# Patient Record
Sex: Female | Born: 1995 | Race: White | Hispanic: No | Marital: Single | Smoking: Never smoker
Health system: Southern US, Community
[De-identification: ages and names within clinical notes are randomized; demographics above are authoritative.]

## PROBLEM LIST (undated history)

## (undated) HISTORY — PX: ANTERIOR CRUCIATE LIGAMENT REPAIR: SHX115

---

## 2017-04-09 ENCOUNTER — Emergency Department (HOSPITAL_COMMUNITY): Payer: Medicare (Managed Care)

## 2017-04-09 ENCOUNTER — Encounter (HOSPITAL_COMMUNITY): Payer: Self-pay

## 2017-04-09 ENCOUNTER — Emergency Department (HOSPITAL_COMMUNITY)
Admission: EM | Admit: 2017-04-09 | Discharge: 2017-04-09 | Disposition: A | Discharge status: Home / Self Care | Payer: Medicare (Managed Care) | Attending: Emergency Medicine | Admitting: Emergency Medicine

## 2017-04-09 DIAGNOSIS — R1013 Epigastric pain: Secondary | ICD-10-CM | POA: Diagnosis present

## 2017-04-09 DIAGNOSIS — R110 Nausea: Secondary | ICD-10-CM

## 2017-04-09 DIAGNOSIS — K2970 Gastritis, unspecified, without bleeding: Secondary | ICD-10-CM | POA: Diagnosis not present

## 2017-04-09 DIAGNOSIS — R102 Pelvic and perineal pain: Secondary | ICD-10-CM

## 2017-04-09 DIAGNOSIS — N390 Urinary tract infection, site not specified: Secondary | ICD-10-CM | POA: Insufficient documentation

## 2017-04-09 DIAGNOSIS — R109 Unspecified abdominal pain: Secondary | ICD-10-CM

## 2017-04-09 LAB — CBC
HEMATOCRIT: 42.8 % (ref 36.0–46.0)
Hemoglobin: 14.3 g/dL (ref 12.0–15.0)
MCH: 29 pg (ref 26.0–34.0)
MCHC: 33.4 g/dL (ref 30.0–36.0)
MCV: 86.8 fL (ref 78.0–100.0)
Platelets: 293 10*3/uL (ref 150–400)
RBC: 4.93 MIL/uL (ref 3.87–5.11)
RDW: 13.2 % (ref 11.5–15.5)
WBC: 10.7 10*3/uL — ABNORMAL HIGH (ref 4.0–10.5)

## 2017-04-09 LAB — URINALYSIS, ROUTINE W REFLEX MICROSCOPIC
Bilirubin Urine: NEGATIVE
Glucose, UA: NEGATIVE mg/dL
Ketones, ur: NEGATIVE mg/dL
NITRITE: NEGATIVE
PROTEIN: NEGATIVE mg/dL
SPECIFIC GRAVITY, URINE: 1.014 (ref 1.005–1.030)
pH: 5 (ref 5.0–8.0)

## 2017-04-09 LAB — COMPREHENSIVE METABOLIC PANEL
ALBUMIN: 4.2 g/dL (ref 3.5–5.0)
ALT: 19 U/L (ref 14–54)
AST: 19 U/L (ref 15–41)
Alkaline Phosphatase: 64 U/L (ref 38–126)
Anion gap: 8 (ref 5–15)
BILIRUBIN TOTAL: 0.7 mg/dL (ref 0.3–1.2)
BUN: 7 mg/dL (ref 6–20)
CO2: 24 mmol/L (ref 22–32)
Calcium: 9.2 mg/dL (ref 8.9–10.3)
Chloride: 106 mmol/L (ref 101–111)
Creatinine, Ser: 0.78 mg/dL (ref 0.44–1.00)
GFR calc Af Amer: >60 mL/min (ref >60)
GFR calc non Af Amer: >60 mL/min (ref >60)
GLUCOSE: 97 mg/dL (ref 65–99)
POTASSIUM: 4.1 mmol/L (ref 3.5–5.1)
SODIUM: 138 mmol/L (ref 135–145)
TOTAL PROTEIN: 7.4 g/dL (ref 6.5–8.1)

## 2017-04-09 LAB — I-STAT BETA HCG BLOOD, ED (MC, WL, AP ONLY)

## 2017-04-09 LAB — LIPASE, BLOOD: Lipase: 31 U/L (ref 11–51)

## 2017-04-09 MED ORDER — FAMOTIDINE IN NACL 20-0.9 MG/50ML-% IV SOLN
20.0000 mg | Freq: Once | INTRAVENOUS | Status: AC
  Administered 2017-04-09: 20 mg via INTRAVENOUS
  Filled 2017-04-09: qty 50

## 2017-04-09 MED ORDER — ONDANSETRON 4 MG PO TBDP
4.0000 mg | ORAL_TABLET | Freq: Once | ORAL | Status: AC | PRN
  Administered 2017-04-09: 4 mg via ORAL

## 2017-04-09 MED ORDER — KETOROLAC TROMETHAMINE 30 MG/ML IJ SOLN
30.0000 mg | Freq: Once | INTRAMUSCULAR | Status: AC
Start: 2017-04-09 — End: 2017-04-09
  Administered 2017-04-09: 30 mg via INTRAVENOUS
  Filled 2017-04-09: qty 1

## 2017-04-09 MED ORDER — SULFAMETHOXAZOLE-TRIMETHOPRIM 800-160 MG PO TABS: 1.0000 | ORAL_TABLET | Freq: Two times a day (BID) | ORAL | 0 refills | Status: AC

## 2017-04-09 MED ORDER — RANITIDINE HCL 150 MG PO TABS: 150.0000 mg | ORAL_TABLET | Freq: Two times a day (BID) | ORAL | 0 refills | Status: AC

## 2017-04-09 MED ORDER — MORPHINE SULFATE (PF) 4 MG/ML IV SOLN
4.0000 mg | Freq: Once | INTRAVENOUS | Status: AC
  Administered 2017-04-09: 4 mg via INTRAVENOUS
  Filled 2017-04-09: qty 1

## 2017-04-09 MED ORDER — SODIUM CHLORIDE 0.9 % IV BOLUS (SEPSIS)
1000.0000 mL | Freq: Once | INTRAVENOUS | Status: AC
  Administered 2017-04-09: 1000 mL via INTRAVENOUS

## 2017-04-09 MED ORDER — PROMETHAZINE HCL 25 MG/ML IJ SOLN
25.0000 mg | Freq: Once | INTRAMUSCULAR | Status: AC
  Administered 2017-04-09: 25 mg via INTRAVENOUS
  Filled 2017-04-09: qty 1

## 2017-04-09 MED ORDER — IOPAMIDOL (ISOVUE-300) INJECTION 61%
INTRAVENOUS | Status: AC
  Administered 2017-04-09: 100 mL
  Filled 2017-04-09: qty 100

## 2017-04-09 MED ORDER — ONDANSETRON 4 MG PO TBDP
ORAL_TABLET | ORAL | Status: AC
  Filled 2017-04-09: qty 1

## 2017-04-09 MED ORDER — PROMETHAZINE HCL 25 MG PO TABS: 25.0000 mg | ORAL_TABLET | Freq: Four times a day (QID) | ORAL | 0 refills | Status: AC | PRN

## 2017-04-09 NOTE — ED Notes (Signed)
Pt. To ultrasound.

## 2017-04-09 NOTE — ED Provider Notes (Signed)
Sign out from [address], PA-C at shift change  Patient is previously healthy 21yoF who presents with sharp and stabbing epigastric, RUQ, and lower abdomen that began yesterday and has gotten worse throughout the day today. On exam, patient has diffuse lower abdominal tenderness.  She has had associated nausea. Please see previous provider's note for complete H&P.  Labs and CTAP is unremarkable, except for probable UTI on UA. Pelvic ultrasound is pending. Suspect soft UTI vs gastritis. Plan to discharge home with Zantac, phenergan, and Bactrim for UTI. Follow up to PCP.   Pelvic ultrasound is negative.  Patient still having pain in the same places.  She is tender in the epigastric, right upper quadrant, and right lower quadrant on my exam.  I will give a dose of Toradol prior to discharge.  Patient will be discharged with above medications.  Strict return precautions given.  Patient understands and agrees with plan.  Patient discharged in satisfactory condition.   Emi HolesLaw, Harryette Shuart M, PA-C 04/10/17 0050    Bethann BerkshireZammit, Joseph, MD 04/10/17 [phone]

## 2017-04-09 NOTE — Discharge Instructions (Addendum)
Your abdominal pain could be due to a variety of different causes, including but not limited to gastritis or an ulcer, combined with a urinary tract infection. Take antibiotics as directed until completed. Start taking zantac as directed, and avoid spicy/fatty/acidic foods, avoid soda/coffee/tea/alcohol. Avoid laying down flat within 30 minutes of eating. Avoid NSAIDs like ibuprofen/aleve/motrin/etc on an empty stomach. May consider using over the counter tums/maalox as needed for additional relief. Use phenergan as directed as needed for nausea. Stay well hydrated. Use tylenol as needed for pain. Follow up with your primary care provider at the campus clinic in 5-7 days for recheck of symptoms. Return to the ER for changes or worsening symptoms.  Abdominal (belly) pain can be caused by many things. Your caregiver performed an examination and possibly ordered blood/urine tests and imaging (CT scan, x-rays, ultrasound). Many cases can be observed and treated at home after initial evaluation in the emergency department. Even though you are being discharged home, abdominal pain can be unpredictable. Therefore, you need a repeated exam if your pain does not resolve, returns, or worsens. Most patients with abdominal pain don't have to be admitted to the hospital or have surgery, but serious problems like appendicitis and gallbladder attacks can start out as nonspecific pain. Many abdominal conditions cannot be diagnosed in one visit, so follow-up evaluations are very important. SEEK IMMEDIATE MEDICAL ATTENTION IF YOU DEVELOP ANY OF THE FOLLOWING SYMPTOMS: The pain does not go away or becomes severe.  A temperature above 101 develops.  Repeated vomiting occurs (multiple episodes).  The pain becomes localized to portions of the abdomen. The right side could possibly be appendicitis. In an adult, the left lower portion of the abdomen could be colitis or diverticulitis.  Blood is being passed in stools or vomit  (bright red or black tarry stools).  Return also if you develop chest pain, difficulty breathing, dizziness or fainting, or become confused, poorly responsive, or inconsolable (young children). The constipation stays for more than 4 days.  There is belly (abdominal) or rectal pain.  You do not seem to be getting better.

## 2017-04-09 NOTE — ED Triage Notes (Signed)
Pt reports epigastric abdominal pain that radiates down right side and into RLQ and LLQ since yesterday at noon. Endorses nausea. Denies vomiting, diarrhea, urinary symptoms. Denies fevers/chills. No abnormal vaginal discharge

## 2017-04-09 NOTE — ED Provider Notes (Signed)
[facility] EMERGENCY DEPARTMENT Provider Note   CSN: [id number] Arrival date & time: 04/09/17  1158     History   Chief Complaint Chief Complaint  Patient presents with  . Abdominal Pain    HPI April Davila is a 21 y.o. otherwise healthy female, who presents to the ED with complaints of one day of gradually worsening epigastric pain. She states that around noon yesterday she developed some dull epigastric pain, which worsened around 8 PM last night and became more sharp and stabbing. She describes the pain is 8/10 constant stabbing and sharp epigastric pain that radiates into the right upper quadrant and across the lower abdomen, worse with coughing, laughing, palpation, and sitting upright, and unrelieved with ibuprofen. She reports associated nausea. She admits to using NSAIDs proximally twice weekly. LMP was 03/30/17. She is sexually active with one female partner, protected with condoms. She has no personal history of kidney stones. She denies fevers, chills, CP, SOB, vomiting, diarrhea/constipation, obstipation, melena, hematochezia, hematuria, dysuria, urinary frequency/urgency, malodorous urine, vaginal bleeding/discharge, myalgias, arthralgias, numbness, tingling, focal weakness, or any other complaints at this time. Denies recent travel, sick contacts, suspicious food intake, EtOH use, recent abx, or prior abd surgeries. Sees a PCP at school [facility]) at the clinic on campus.   The history is provided by medical records and the patient. No language interpreter was used.  Abdominal Pain   This is a new problem. The current episode started yesterday. The problem occurs constantly. The problem has been gradually worsening. The pain is associated with an unknown factor. The pain is located in the epigastric region, RLQ, LLQ, RUQ and suprapubic region. The quality of the pain is sharp. The pain is at a severity of 8/10. The pain is moderate. Associated  symptoms include nausea. Pertinent negatives include fever, diarrhea, flatus, hematochezia, melena, vomiting, constipation, dysuria, frequency, hematuria, arthralgias and myalgias. The symptoms are aggravated by coughing, palpation and certain positions (and laughing). Nothing relieves the symptoms.    History reviewed. No pertinent past medical history.  There are no active problems to display for this patient.   Past Surgical History:  Procedure Laterality Date  . ANTERIOR CRUCIATE LIGAMENT REPAIR      OB History    No data available       Home Medications    Prior to Admission medications   Medication Sig Start Date End Date Taking? Authorizing Provider  cyclobenzaprine (FLEXERIL) 10 MG tablet Take 10 mg by mouth daily as needed for muscle spasms.   Yes [provider]  gabapentin (NEURONTIN) 100 MG capsule Take 100 mg by mouth daily as needed (FOR LEGS).   Yes [provider]  ibuprofen (ADVIL,MOTRIN) 200 MG tablet Take 800 mg by mouth every 6 (six) hours as needed for headache or mild pain.   Yes [provider]  medroxyPROGESTERone (DEPO-PROVERA) 150 MG/ML injection inject 1 milliliter INTO THE SHOULDER, THIGH OR BUTTOCKS EVERY 3 MONTHS. 03/06/17  Yes [provider]  Melatonin 10 MG TABS Take 10 mg by mouth at bedtime as needed (FOR SLEEP).   Yes [provider]    Family History No family history on file.  Social History Social History  Substance Use Topics  . Smoking status: Never Smoker  . Smokeless tobacco: Never Used  . Alcohol use No     Allergies   Penicillins   Review of Systems Review of Systems  Constitutional: Negative for chills and fever.  Respiratory: Negative for shortness  of breath.   Cardiovascular: Negative for chest pain.  Gastrointestinal: Positive for abdominal pain and nausea. Negative for blood in stool, constipation, diarrhea, flatus, hematochezia, melena and vomiting.  Genitourinary:  Negative for dysuria, frequency, hematuria, urgency, vaginal bleeding and vaginal discharge.       No malodorous urine  Musculoskeletal: Negative for arthralgias and myalgias.  Skin: Negative for color change.  Allergic/Immunologic: Negative for immunocompromised state.  Neurological: Negative for weakness and numbness.  Psychiatric/Behavioral: Negative for confusion.   All other systems reviewed and are negative for acute change except as noted in the HPI.    Physical Exam Updated Vital Signs BP 127/88 (BP Location: Left Arm)   Pulse (!) 104   Temp 98 F (36.7 C) (Oral)   Resp 17   Ht 4\' 10"  (1.473 m)   Wt 90.7 kg (200 lb)   LMP 04/06/2017   SpO2 100%   BMI 41.80 kg/m   Physical Exam  Constitutional: She is oriented to person, place, and time. Vital signs are normal. She appears well-developed and well-nourished.  Non-toxic appearance. No distress.  Afebrile, nontoxic, NAD  HENT:  Head: Normocephalic and atraumatic.  Mouth/Throat: Oropharynx is clear and moist and mucous membranes are normal.  Eyes: Conjunctivae and EOM are normal. Right eye exhibits no discharge. Left eye exhibits no discharge.  Neck: Normal range of motion. Neck supple.  Cardiovascular: Normal rate, regular rhythm, normal heart sounds and intact distal pulses.  Exam reveals no gallop and no friction rub.   No murmur heard. Marginally tachycardic in triage which resolved on exam  Pulmonary/Chest: Effort normal and breath sounds normal. No respiratory distress. She has no decreased breath sounds. She has no wheezes. She has no rhonchi. She has no rales.  Abdominal: Soft. Normal appearance and bowel sounds are normal. She exhibits no distension. There is tenderness in the right upper quadrant, right lower quadrant, epigastric area, suprapubic area and left lower quadrant. There is tenderness at McBurney's point and positive Murphy's sign (equivocal). There is no rigidity, no rebound, no guarding and no CVA  tenderness.  Soft, obese but nondistended, +BS throughout, with mild epigastric and RUQ TTP and mild/moderate diffuse lower abd TTP most focally at mcburney's point although somewhat generalized in the other areas as well; no r/g/r, equivocal murphy's exam since pain is elicited however pt still able to fully inspire, +mcburney's point tenderness, no CVA TTP   Genitourinary:  Genitourinary Comments: Pt declined  Musculoskeletal: Normal range of motion.  Neurological: She is alert and oriented to person, place, and time. She has normal strength. No sensory deficit.  Skin: Skin is warm, dry and intact. No rash noted.  Psychiatric: She has a normal mood and affect.  Nursing note and vitals reviewed.    ED Treatments / Results  Labs (all labs ordered are listed, but only abnormal results are displayed) Labs Reviewed  CBC - Abnormal; Notable for the following:       Result Value   WBC 10.7 (*)    All other components within normal limits  URINALYSIS, ROUTINE W REFLEX MICROSCOPIC - Abnormal; Notable for the following:    APPearance HAZY (*)    Hgb urine dipstick SMALL (*)    Leukocytes, UA LARGE (*)    Bacteria, UA RARE (*)    Squamous Epithelial / LPF 0-5 (*)    All other components within normal limits  URINE CULTURE  LIPASE, BLOOD  COMPREHENSIVE METABOLIC PANEL  I-STAT BETA HCG BLOOD, ED ([facility], [facility], [facility] ONLY)  EKG  EKG Interpretation None       Radiology Ct Abdomen Pelvis W Contrast  Result Date: 04/09/2017 CLINICAL DATA:  Epigastric abdominal pain radiating into the right and left lower quadrants. EXAM: CT ABDOMEN AND PELVIS WITH CONTRAST TECHNIQUE: Multidetector CT imaging of the abdomen and pelvis was performed using the standard protocol following bolus administration of intravenous contrast. CONTRAST:  ISOVUE-300 IOPAMIDOL (ISOVUE-300) INJECTION 61% COMPARISON:  None. FINDINGS: Lower chest: No acute abnormality. Hepatobiliary: No focal liver abnormality is seen. No  gallstones, gallbladder wall thickening, or biliary dilatation. Pancreas: Unremarkable. No pancreatic ductal dilatation or surrounding inflammatory changes. Spleen: Normal in size without focal abnormality. Adrenals/Urinary Tract: Adrenal glands are unremarkable. Kidneys are normal, without renal calculi, focal lesion, or hydronephrosis. Bladder is unremarkable. Stomach/Bowel: Stomach is within normal limits. Appendix appears normal. No evidence of bowel wall thickening, distention, or inflammatory changes. Vascular/Lymphatic: No significant vascular findings are present. No enlarged abdominal or pelvic lymph nodes. Reproductive: Uterus and bilateral adnexa are unremarkable. Other: Small fat containing supraumbilical ventral hernia. No free fluid or pneumoperitoneum. Musculoskeletal: No acute or significant osseous findings. 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 IMPRESSION: 1.  No acute intra-abdominal process. 2. 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 Electronically Signed   By: Obie Dredge M.D.   On: 04/09/2017 16:04    Procedures Procedures (including critical care time)  Medications Ordered in ED Medications  ondansetron (ZOFRAN-ODT) 4 MG disintegrating tablet (not administered)  ondansetron (ZOFRAN-ODT) disintegrating tablet 4 mg (4 mg Oral Given 04/09/17 1230)  promethazine (PHENERGAN) injection 25 mg (25 mg Intravenous Given 04/09/17 1455)  sodium chloride 0.9 % bolus 1,000 mL (0 mLs Intravenous Stopped 04/09/17 1724)  morphine 4 MG/ML injection 4 mg (4 mg Intravenous Given 04/09/17 1456)  famotidine (PEPCID) IVPB 20 mg premix (0 mg Intravenous Stopped 04/09/17 1535)  iopamidol (ISOVUE-300) 61 % injection (100 mLs  Contrast Given 04/09/17 1545)  morphine 4 MG/ML injection 4 mg (4 mg Intravenous Given 04/09/17 1740)     Initial Impression / Assessment and Plan / ED Course  I have reviewed the triage vital signs and the nursing  notes.  Pertinent labs & imaging results that were available during my care of the patient were reviewed by me and considered in my medical decision making (see chart for details).     21 y.o. female here with 1 day of epigastric pain that radiates to RUQ and across lower abd, with associated nausea. On exam, mild epigastric and RUQ TTP, pain elicited with murphy's exam but pt able to fully inspire, and mild/moderate tenderness across lower abdomen but most notably at mcburney's point, although nonperitoneal with no rebound/guarding/rigidity. Work up thus far reveals: betaHCG neg, U/A with 0-5 squamous so slightly contaminated but with 6-30 WBCs and large leuks which could represent UTI so will send for UCx; lipase and CMP WNL, CBC with marginally elevated WBC 10.7. Hard to say exactly what the cause of her complaints is, if it were strictly upper abd pain I'd think possibly gallbladder etiology; if it were lower abd only I'd think pelvic organ vs appendix etiology; however since it's a combination of both, it's really hard to say. Will proceed with CT abd/pelv to fully evaluate all possible causes, and then decide from there if we need to proceed with any other testing such as pelvic exam/ultrasound/etc. Will give pepcid, phenergan, morphine, and fluids, then reassess shortly.   5:31 PM CTA/P without any  acute findings to explain her symptoms, tiny supraumbilical hernia with fat but otherwise nothing see; appendix and gallbladder normal, no pelvic organ findings. Pt feeling slightly better with nausea, but pain is reoccurring now that morphine is wearing off, states now it's more lower abdominal pain than upper. Long discussion had regarding options of proceeding with pelvic exam and potentially pelvic U/S, vs pelvic U/S alone, vs treating this as UTI since it could still be that; pt ultimately decided that she'd rather have pelvic U/S alone WITHOUT pelvic examination by me; I think that's reasonable given  lack of any vaginal discharge and likelihood that pelvic exam wouldn't change our decision to do pelvic U/S. Will proceed with this option, give more pain med, and reassess afterwards.   8:09 PM Pelvic U/S pending. Patient care to be resumed by Glenford BayleyAlex Law, PA-C at shift change sign-out. Patient history has been discussed with midlevel resuming care.  If U/S negative then instructions are as follows: Overall, some of her symptoms are consistent with gastritis/PUD/GERD combined with UTI. For gastritis/GERD, discussed diet/lifestyle modifications for symptom control, will start on zantac and give rx for phenergan, advised tylenol and avoidance/sparing use of NSAIDs only on full stomach, discussed other OTC remedies for symptomatic relief, and f/up with PCP in 5-7 days for recheck of symptoms and ongoing evaluation/management. As for UTI, will rx abx, advised staying hydrated, and f/up with PCP in 5-7 days for recheck.  Please see Glenford BayleyAlex Law PA-C's notes for further documentation of pending results and dispo/care. Pt stable at sign-out and updated on transfer of care.      Final Clinical Impressions(s) / ED Diagnoses   Final diagnoses:  Pelvic pain  Abdominal pain, unspecified abdominal location  Nausea  Lower urinary tract infectious disease  Gastritis, presence of bleeding unspecified, unspecified chronicity, unspecified gastritis type    New Prescriptions New Prescriptions   PROMETHAZINE (PHENERGAN) 25 MG TABLET    Take 1 tablet (25 mg total) by mouth every 6 (six) hours as needed for nausea or vomiting.   RANITIDINE (ZANTAC) 150 MG TABLET    Take 1 tablet (150 mg total) by mouth 2 (two) times daily.   SULFAMETHOXAZOLE-TRIMETHOPRIM (BACTRIM DS,SEPTRA DS) 800-160 MG TABLET    Take 1 tablet by mouth 2 (two) times daily. x5 days     [address], GordonMercedes, [place]PA-C 04/09/17 2013    Bethann BerkshireZammit, Joseph, MD 04/10/17 [phone]

## 2017-04-10 LAB — URINE CULTURE

## 2019-03-12 IMAGING — CT CT ABD-PELV W/ CM
2 of 4 series · 17 of 46 positions shown, 19 images · IV contrast (Omni 300)
Comparison: None.

CLINICAL DATA: Epigastric abdominal pain radiating into the right
and left lower quadrants.

EXAM:
CT ABDOMEN AND PELVIS WITH CONTRAST
TECHNIQUE: Multidetector CT imaging of the abdomen and pelvis was performed
using the standard protocol following bolus administration of
intravenous contrast.
CONTRAST:  100mL UW5HWL-BWW IOPAMIDOL (UW5HWL-BWW) INJECTION 61%

[Series 3: a/p w/ 5mm · axial · 0.96mm/px · z∈[+738,+1198]mm · 14 of 102 slices shown, 16 images]
[im 5/102  soft-tissue]
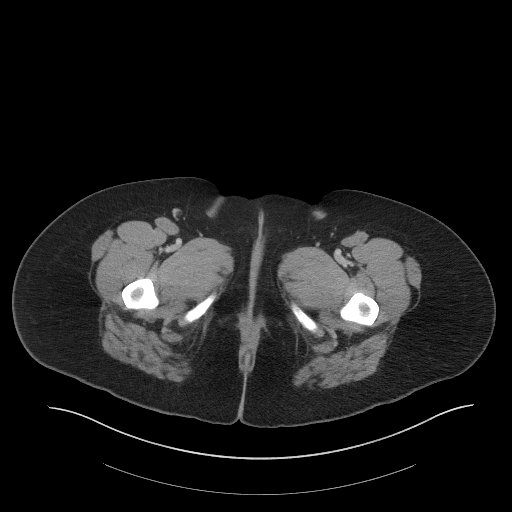
[im 5/102  bone]
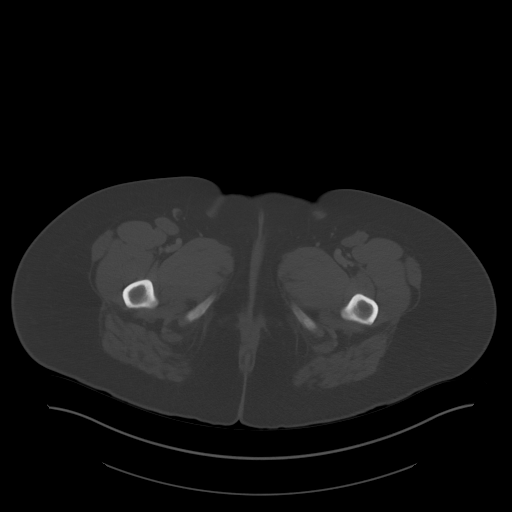
[im 13/102  soft-tissue]
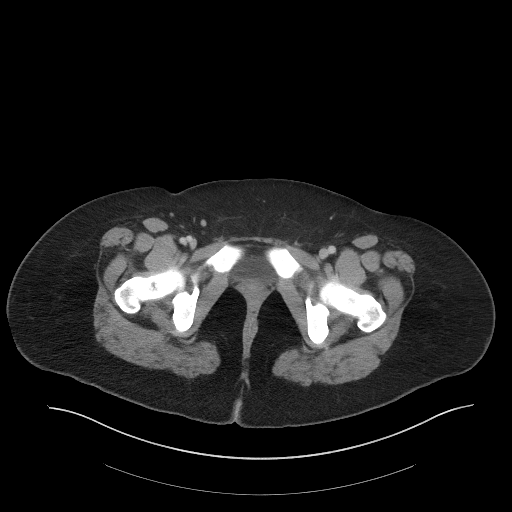
[im 22/102  soft-tissue]
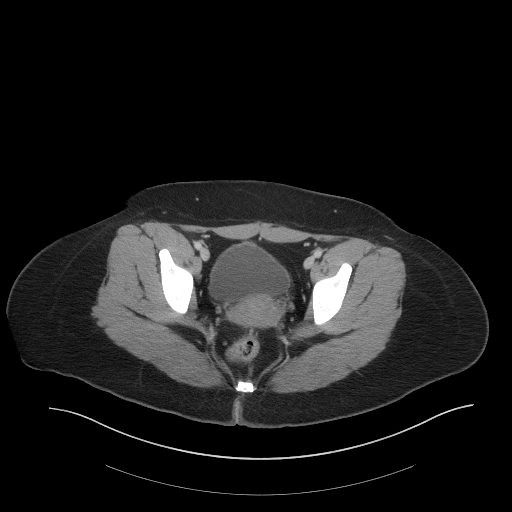
[im 26/102  soft-tissue]
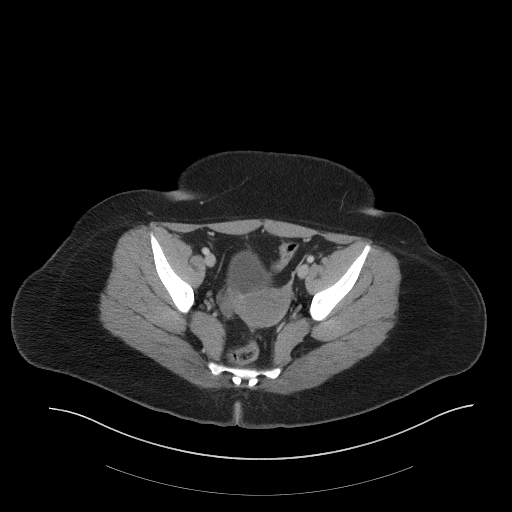
[im 34/102  soft-tissue]
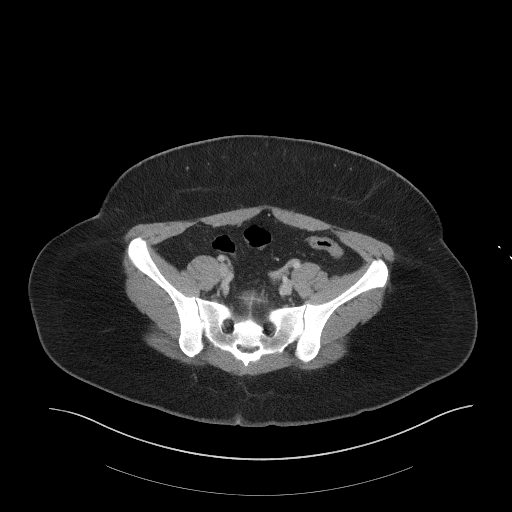
[im 43/102  soft-tissue]
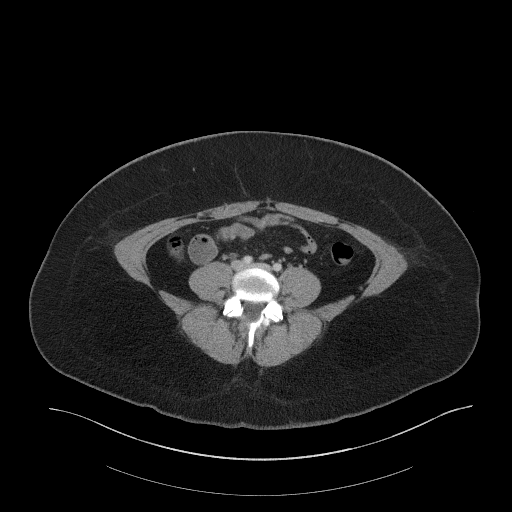
[im 47/102  soft-tissue]
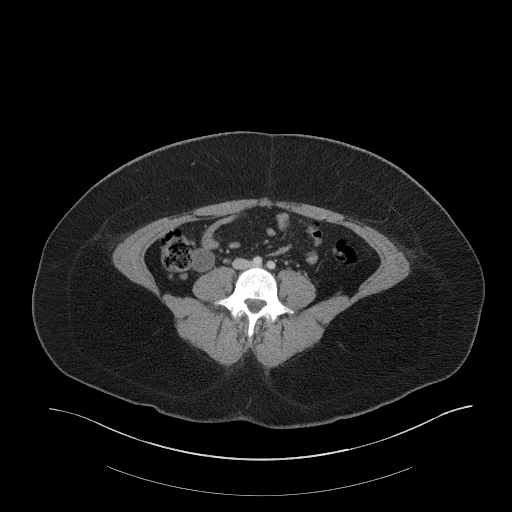
[im 55/102  soft-tissue]
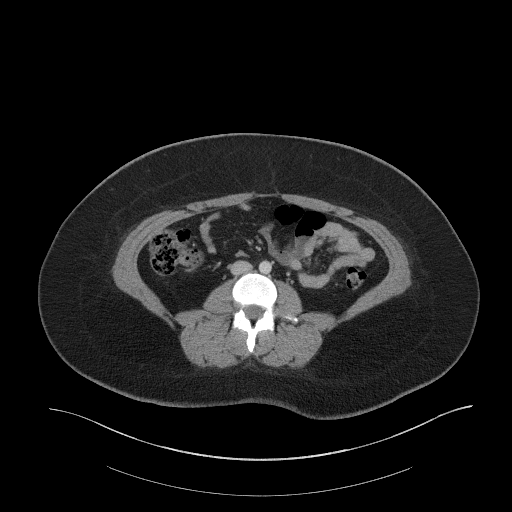
[im 59/102  soft-tissue]
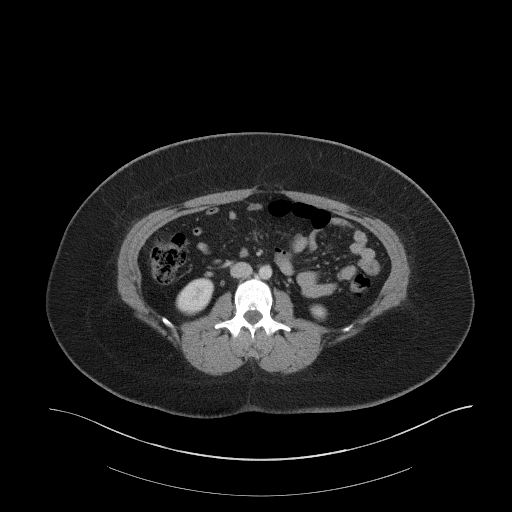
[im 59/102  bone]
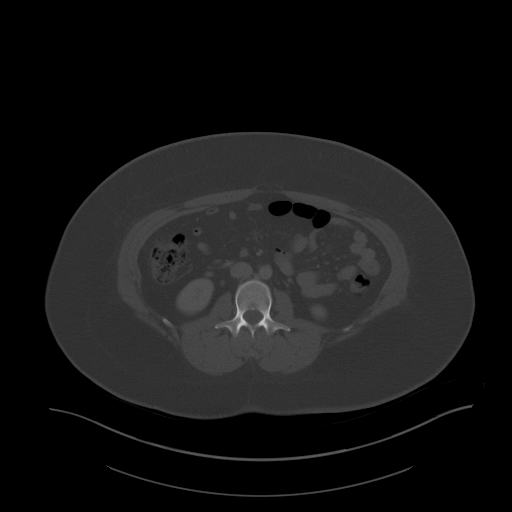
[im 68/102  soft-tissue]
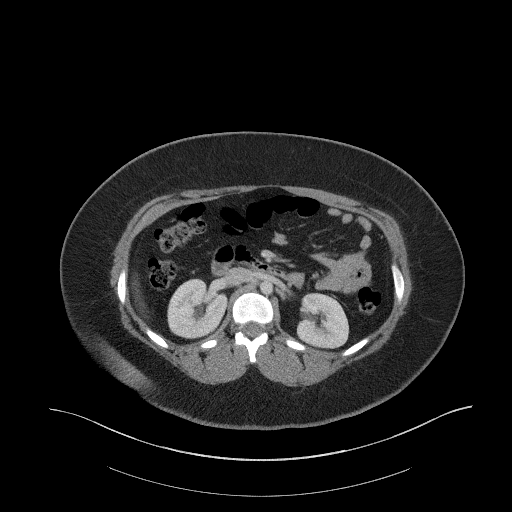
[im 76/102  soft-tissue]
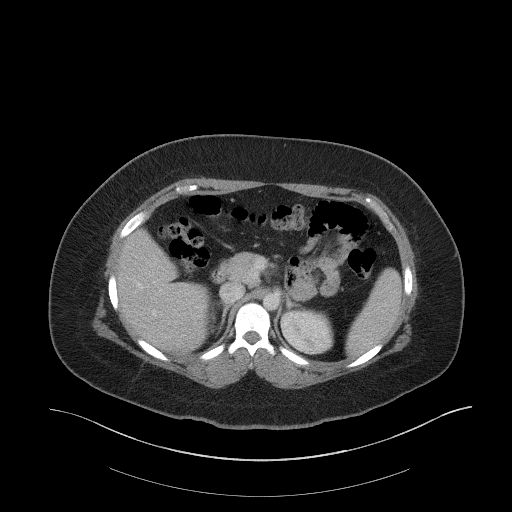
[im 80/102  soft-tissue]
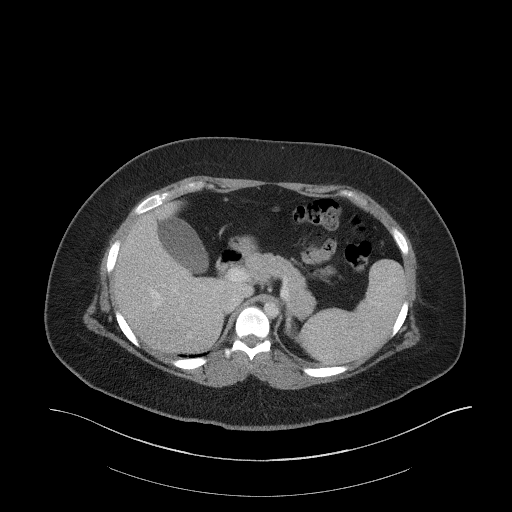
[im 89/102  soft-tissue]
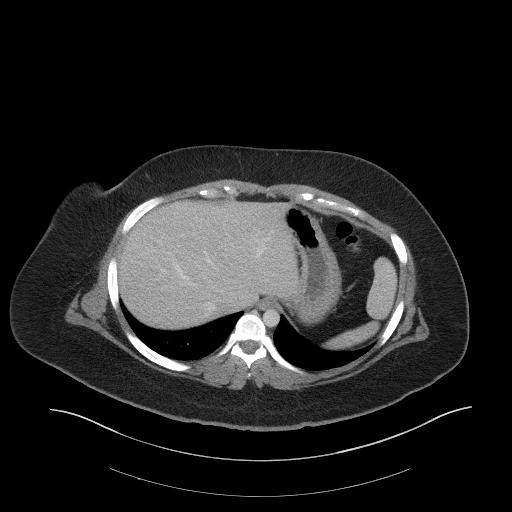
[im 97/102  soft-tissue]
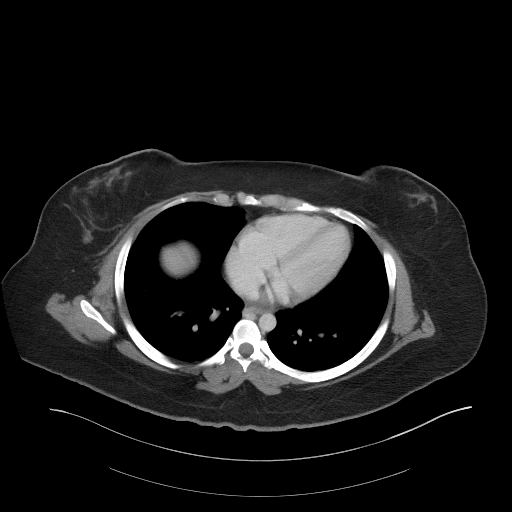

[Series 6: a/p w/ cor · coronal · 0.94mm/px · 3 of 159 slices shown]
[im 53/159  soft-tissue]
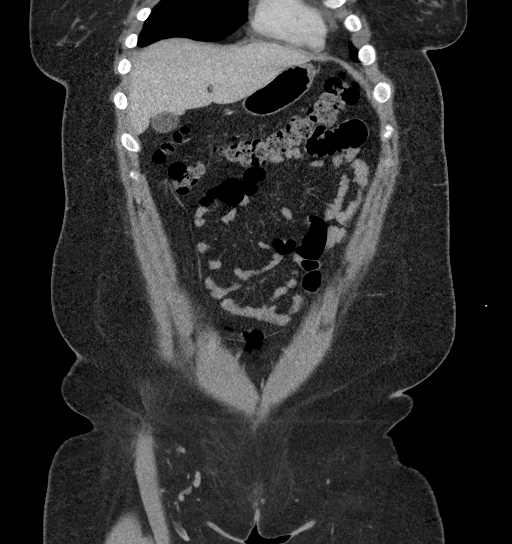
[im 71/159  soft-tissue]
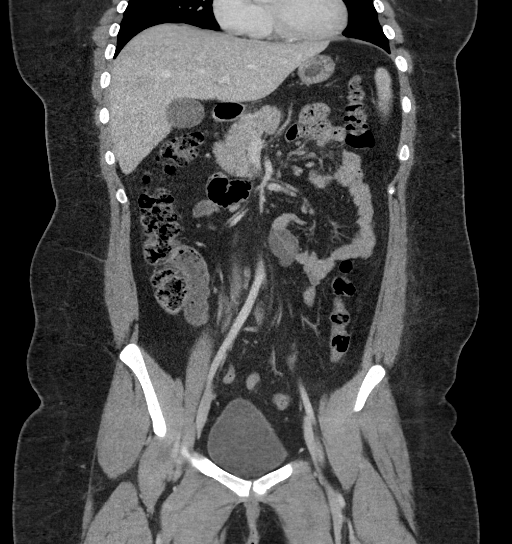
[im 88/159  soft-tissue]
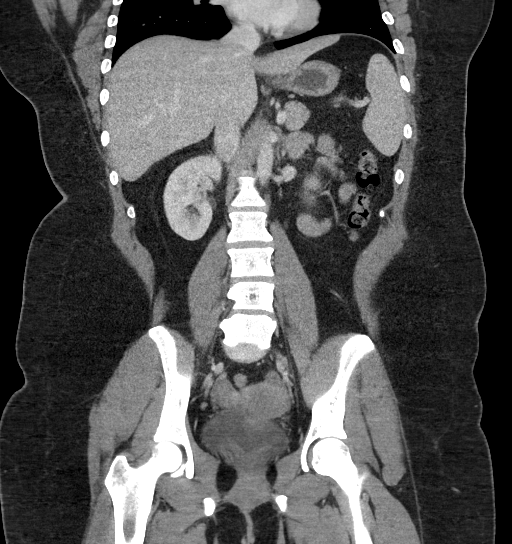

[17 of 46 positions shown; findings below may reference images not displayed]

FINDINGS: Lower chest: No acute abnormality.

Hepatobiliary: No focal liver abnormality is seen. No gallstones,
gallbladder wall thickening, or biliary dilatation.

Pancreas: Unremarkable. No pancreatic ductal dilatation or
surrounding inflammatory changes.

Spleen: Normal in size without focal abnormality.

Adrenals/Urinary Tract: Adrenal glands are unremarkable. Kidneys are
normal, without renal calculi, focal lesion, or hydronephrosis.
Bladder is unremarkable.

Stomach/Bowel: Stomach is within normal limits. Appendix appears
normal. No evidence of bowel wall thickening, distention, or
inflammatory changes.

Vascular/Lymphatic: No significant vascular findings are present. No
enlarged abdominal or pelvic lymph nodes.

Reproductive: Uterus and bilateral adnexa are unremarkable.

Other: Small fat containing supraumbilical ventral hernia. No free
fluid or pneumoperitoneum.

Musculoskeletal: No acute or significant osseous findings. Trace
anterolisthesis of L5 on S1 due to bilateral pars defects.
IMPRESSION: 1.  No acute intra-abdominal process.
2. Trace anterolisthesis of L5 on S1 due to bilateral pars defects.
